# Patient Record
Sex: Female | Born: 1945 | Race: White | Hispanic: No | Marital: Single | Smoking: Former smoker
Health system: Southern US, Community
[De-identification: ages and names within clinical notes are randomized; demographics above are authoritative.]

## PROBLEM LIST (undated history)

## (undated) DIAGNOSIS — F410 Panic disorder [episodic paroxysmal anxiety] without agoraphobia: Secondary | ICD-10-CM

## (undated) DIAGNOSIS — J45909 Unspecified asthma, uncomplicated: Secondary | ICD-10-CM

## (undated) DIAGNOSIS — C189 Malignant neoplasm of colon, unspecified: Secondary | ICD-10-CM

## (undated) DIAGNOSIS — F419 Anxiety disorder, unspecified: Secondary | ICD-10-CM

## (undated) HISTORY — DX: Unspecified asthma, uncomplicated: J45.909

## (undated) HISTORY — DX: Anxiety disorder, unspecified: F41.9

## (undated) HISTORY — DX: Panic disorder (episodic paroxysmal anxiety): F41.0

## (undated) HISTORY — PX: ABDOMINAL HYSTERECTOMY: SHX81

## (undated) HISTORY — PX: COLON SURGERY: SHX602

## (undated) HISTORY — DX: Malignant neoplasm of colon, unspecified: C18.9

---

## 2010-09-01 ENCOUNTER — Emergency Department (HOSPITAL_COMMUNITY): Admission: EM | Admit: 2010-09-01 | Discharge: 2010-09-01 | Payer: Self-pay | Admitting: Emergency Medicine

## 2013-03-22 ENCOUNTER — Encounter: Payer: Self-pay | Admitting: Obstetrics & Gynecology

## 2015-05-02 ENCOUNTER — Encounter: Payer: Self-pay | Admitting: Pulmonary Disease

## 2015-05-02 ENCOUNTER — Ambulatory Visit (INDEPENDENT_AMBULATORY_CARE_PROVIDER_SITE_OTHER)
Admission: RE | Admit: 2015-05-02 | Discharge: 2015-05-02 | Disposition: A | Discharge status: Home / Self Care | Payer: Medicare Other | Source: Ambulatory Visit | Attending: Pulmonary Disease | Admitting: Pulmonary Disease

## 2015-05-02 ENCOUNTER — Ambulatory Visit (INDEPENDENT_AMBULATORY_CARE_PROVIDER_SITE_OTHER): Payer: Medicare Other | Admitting: Pulmonary Disease

## 2015-05-02 ENCOUNTER — Encounter (INDEPENDENT_AMBULATORY_CARE_PROVIDER_SITE_OTHER): Payer: Self-pay

## 2015-05-02 VITALS — BP 122/76 | HR 80 | Temp 98.1°F | Ht 60.0 in | Wt 162.4 lb

## 2015-05-02 DIAGNOSIS — J449 Chronic obstructive pulmonary disease, unspecified: Secondary | ICD-10-CM | POA: Insufficient documentation

## 2015-05-02 DIAGNOSIS — J441 Chronic obstructive pulmonary disease with (acute) exacerbation: Secondary | ICD-10-CM

## 2015-05-02 MED ORDER — UMECLIDINIUM BROMIDE 62.5 MCG/INH IN AEPB: 1.0000 | INHALATION_SPRAY | Freq: Every day | RESPIRATORY_TRACT | Status: AC

## 2015-05-02 MED ORDER — BUDESONIDE-FORMOTEROL FUMARATE 160-4.5 MCG/ACT IN AERO: 2.0000 | INHALATION_SPRAY | Freq: Two times a day (BID) | RESPIRATORY_TRACT | Status: AC

## 2015-05-02 NOTE — Patient Instructions (Addendum)
Zarina- it was nice meeting you today...  Let's work together to find the best medications to help your breathing!!!  For your part> I need to be sure you are not smoking!!!    And we need for you to start a gradual exercise program (consider joining the Y, silver sneakers, or do your own exercises at home)...    And we need for you to get on a good diet (low carb, low fat) and get the weight down!!!  I would like you to take 2 inhalers regularly> these will help open up your airways & diminish the wheezing...    SYMBICORT160- 2 sprays twice daily...    INCRUSE - one inhalation daily...    We will check to see if we can find out which meds you can get for the cheapest price...  I would also ask you to adjust your Valium10 to 1/2 tablet 4 times daily at breakfast, lunch, dinner, & bedtime...    You should carry the ALBUTEROL inhaler with you & use it as needed...  Today we did a breathing test, ambulatory oxygen test & a CXR...    We will contact you w/ the results when available...   Call for any questions...  Let's plan a follow up visit in 69mo, sooner if needed for problems.Marland KitchenMarland Kitchen
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
## 2015-05-07 ENCOUNTER — Telehealth: Payer: Self-pay | Admitting: *Deleted

## 2015-05-07 NOTE — Telephone Encounter (Signed)
PA submitted over the phone for Incruse. [employer] will report decision in 24 to 72 hrs. Pt was called and message left in regards to ins. Information.

## 2015-05-07 NOTE — Telephone Encounter (Signed)
PA has been denied. Pt must first fail Stiolto Respimat and Arcapta Neohaler. Please advise

## 2015-05-08 MED ORDER — TIOTROPIUM BROMIDE-OLODATEROL 2.5-2.5 MCG/ACT IN AERS: 2.0000 | INHALATION_SPRAY | Freq: Every day | RESPIRATORY_TRACT | Status: AC

## 2015-05-08 NOTE — Telephone Encounter (Signed)
Message printed and placed on  SN cart for review  

## 2015-05-08 NOTE — Telephone Encounter (Signed)
Called spoke with pt. Aware of change. RX sent into pharm for stiolto. Nothing further needed

## 2015-05-08 NOTE — Telephone Encounter (Signed)
Per SN, -Since insurance denied Incruse, please call in [employer] with instruction for 2 puffs once daily -Please notify pt of medication change

## 2015-05-09 ENCOUNTER — Other Ambulatory Visit: Payer: Self-pay | Admitting: Pulmonary Disease

## 2015-05-09 ENCOUNTER — Telehealth: Payer: Self-pay

## 2015-05-09 DIAGNOSIS — R918 Other nonspecific abnormal finding of lung field: Secondary | ICD-10-CM

## 2015-05-09 DIAGNOSIS — J449 Chronic obstructive pulmonary disease, unspecified: Secondary | ICD-10-CM

## 2015-05-09 DIAGNOSIS — R9389 Abnormal findings on diagnostic imaging of other specified body structures: Secondary | ICD-10-CM

## 2015-05-09 NOTE — Progress Notes (Signed)
Quick Note:  Called and spoke with pt. Reviewed results and recs. Pt voiced understanding and had no further questions. Order for CT was placed. See telephone note. ______

## 2015-05-09 NOTE — Telephone Encounter (Signed)
-----   Message from Noralee Space, MD sent at 05/05/2015 10:29 AM EDT ----- Please notify patient>   CXR shows underlying COPD & a faint LUL opacity that needs further eval- REC- sched CT Chest w/ contrast "LUL opacity on CXR"

## 2015-05-09 NOTE — Telephone Encounter (Signed)
Called and spoke with pt. Reviewed results and recs. Pt voiced understanding and had no further questions.  Order was placed for CT on 7/21 per SN recs.   REC- sched CT Chest w/ contrast "LUL opacity on CXR"   Pt is aware and awaiting phone call to schedule CT.  Nothing further needed.

## 2015-05-13 ENCOUNTER — Ambulatory Visit (INDEPENDENT_AMBULATORY_CARE_PROVIDER_SITE_OTHER)
Admission: RE | Admit: 2015-05-13 | Discharge: 2015-05-13 | Disposition: A | Discharge status: Home / Self Care | Payer: Medicare Other | Source: Ambulatory Visit | Attending: Pulmonary Disease | Admitting: Pulmonary Disease

## 2015-05-13 DIAGNOSIS — R918 Other nonspecific abnormal finding of lung field: Secondary | ICD-10-CM

## 2015-05-14 ENCOUNTER — Other Ambulatory Visit: Payer: Self-pay | Admitting: Pulmonary Disease

## 2015-05-14 DIAGNOSIS — R918 Other nonspecific abnormal finding of lung field: Secondary | ICD-10-CM

## 2015-05-24 ENCOUNTER — Other Ambulatory Visit: Payer: Self-pay | Admitting: Pulmonary Disease

## 2015-05-24 ENCOUNTER — Encounter (HOSPITAL_COMMUNITY)
Admission: RE | Admit: 2015-05-24 | Discharge: 2015-05-24 | Disposition: A | Discharge status: Home / Self Care | Payer: Medicare Other | Source: Ambulatory Visit | Attending: Pulmonary Disease | Admitting: Pulmonary Disease

## 2015-05-24 DIAGNOSIS — R918 Other nonspecific abnormal finding of lung field: Secondary | ICD-10-CM | POA: Insufficient documentation

## 2015-05-24 LAB — GLUCOSE, CAPILLARY: Glucose-Capillary: 81 mg/dL (ref 65–99)

## 2015-05-24 MED ORDER — FLUDEOXYGLUCOSE F - 18 (FDG) INJECTION
7.7300 | Freq: Once | INTRAVENOUS | Status: AC | PRN
  Administered 2015-05-24: 7.73 via INTRAVENOUS

## 2015-05-30 ENCOUNTER — Other Ambulatory Visit: Payer: Self-pay | Admitting: Pulmonary Disease

## 2015-05-30 DIAGNOSIS — R948 Abnormal results of function studies of other organs and systems: Secondary | ICD-10-CM

## 2015-06-03 ENCOUNTER — Ambulatory Visit: Payer: Medicare Other | Admitting: Pulmonary Disease

## 2015-06-11 ENCOUNTER — Ambulatory Visit: Payer: Medicare Other | Admitting: Pulmonary Disease

## 2016-03-19 DIAGNOSIS — J9621 Acute and chronic respiratory failure with hypoxia: Secondary | ICD-10-CM

## 2016-03-19 DIAGNOSIS — M1990 Unspecified osteoarthritis, unspecified site: Secondary | ICD-10-CM

## 2016-03-19 DIAGNOSIS — F419 Anxiety disorder, unspecified: Secondary | ICD-10-CM

## 2016-03-19 DIAGNOSIS — J441 Chronic obstructive pulmonary disease with (acute) exacerbation: Secondary | ICD-10-CM

## 2016-03-20 DIAGNOSIS — J441 Chronic obstructive pulmonary disease with (acute) exacerbation: Secondary | ICD-10-CM | POA: Diagnosis not present

## 2016-03-20 DIAGNOSIS — M1990 Unspecified osteoarthritis, unspecified site: Secondary | ICD-10-CM | POA: Diagnosis not present

## 2016-03-20 DIAGNOSIS — J9621 Acute and chronic respiratory failure with hypoxia: Secondary | ICD-10-CM | POA: Diagnosis not present

## 2016-03-20 DIAGNOSIS — F419 Anxiety disorder, unspecified: Secondary | ICD-10-CM | POA: Diagnosis not present

## 2016-03-21 DIAGNOSIS — J9621 Acute and chronic respiratory failure with hypoxia: Secondary | ICD-10-CM | POA: Diagnosis not present

## 2016-03-21 DIAGNOSIS — J441 Chronic obstructive pulmonary disease with (acute) exacerbation: Secondary | ICD-10-CM | POA: Diagnosis not present

## 2016-03-21 DIAGNOSIS — M1990 Unspecified osteoarthritis, unspecified site: Secondary | ICD-10-CM | POA: Diagnosis not present

## 2016-03-21 DIAGNOSIS — F419 Anxiety disorder, unspecified: Secondary | ICD-10-CM | POA: Diagnosis not present

## 2016-03-22 DIAGNOSIS — F419 Anxiety disorder, unspecified: Secondary | ICD-10-CM | POA: Diagnosis not present

## 2016-03-22 DIAGNOSIS — J9621 Acute and chronic respiratory failure with hypoxia: Secondary | ICD-10-CM | POA: Diagnosis not present

## 2016-03-22 DIAGNOSIS — J441 Chronic obstructive pulmonary disease with (acute) exacerbation: Secondary | ICD-10-CM | POA: Diagnosis not present

## 2016-03-22 DIAGNOSIS — M1990 Unspecified osteoarthritis, unspecified site: Secondary | ICD-10-CM | POA: Diagnosis not present

## 2017-04-25 DIAGNOSIS — F418 Other specified anxiety disorders: Secondary | ICD-10-CM

## 2017-04-25 DIAGNOSIS — J219 Acute bronchiolitis, unspecified: Secondary | ICD-10-CM | POA: Diagnosis not present

## 2017-04-25 DIAGNOSIS — E7800 Pure hypercholesterolemia, unspecified: Secondary | ICD-10-CM

## 2017-04-25 DIAGNOSIS — R918 Other nonspecific abnormal finding of lung field: Secondary | ICD-10-CM | POA: Diagnosis not present

## 2017-04-25 DIAGNOSIS — J9621 Acute and chronic respiratory failure with hypoxia: Secondary | ICD-10-CM

## 2017-04-25 DIAGNOSIS — J441 Chronic obstructive pulmonary disease with (acute) exacerbation: Secondary | ICD-10-CM

## 2017-04-25 DIAGNOSIS — I10 Essential (primary) hypertension: Secondary | ICD-10-CM | POA: Diagnosis not present

## 2017-04-25 DIAGNOSIS — I248 Other forms of acute ischemic heart disease: Secondary | ICD-10-CM | POA: Diagnosis not present

## 2017-04-26 DIAGNOSIS — R0602 Shortness of breath: Secondary | ICD-10-CM | POA: Diagnosis not present

## 2017-04-26 DIAGNOSIS — J219 Acute bronchiolitis, unspecified: Secondary | ICD-10-CM | POA: Diagnosis not present

## 2017-04-26 DIAGNOSIS — J9621 Acute and chronic respiratory failure with hypoxia: Secondary | ICD-10-CM | POA: Diagnosis not present

## 2017-04-26 DIAGNOSIS — F418 Other specified anxiety disorders: Secondary | ICD-10-CM | POA: Diagnosis not present

## 2017-04-26 DIAGNOSIS — J441 Chronic obstructive pulmonary disease with (acute) exacerbation: Secondary | ICD-10-CM | POA: Diagnosis not present

## 2017-04-26 DIAGNOSIS — I10 Essential (primary) hypertension: Secondary | ICD-10-CM | POA: Diagnosis not present

## 2017-04-26 DIAGNOSIS — I248 Other forms of acute ischemic heart disease: Secondary | ICD-10-CM | POA: Diagnosis not present

## 2017-04-26 DIAGNOSIS — R918 Other nonspecific abnormal finding of lung field: Secondary | ICD-10-CM | POA: Diagnosis not present

## 2017-04-27 DIAGNOSIS — R918 Other nonspecific abnormal finding of lung field: Secondary | ICD-10-CM | POA: Diagnosis not present

## 2017-04-27 DIAGNOSIS — I248 Other forms of acute ischemic heart disease: Secondary | ICD-10-CM | POA: Diagnosis not present

## 2017-04-27 DIAGNOSIS — J441 Chronic obstructive pulmonary disease with (acute) exacerbation: Secondary | ICD-10-CM | POA: Diagnosis not present

## 2017-04-27 DIAGNOSIS — I10 Essential (primary) hypertension: Secondary | ICD-10-CM | POA: Diagnosis not present

## 2017-04-27 DIAGNOSIS — J9621 Acute and chronic respiratory failure with hypoxia: Secondary | ICD-10-CM | POA: Diagnosis not present

## 2017-04-27 DIAGNOSIS — J219 Acute bronchiolitis, unspecified: Secondary | ICD-10-CM | POA: Diagnosis not present

## 2017-04-27 DIAGNOSIS — F418 Other specified anxiety disorders: Secondary | ICD-10-CM | POA: Diagnosis not present

## 2017-04-28 DIAGNOSIS — J441 Chronic obstructive pulmonary disease with (acute) exacerbation: Secondary | ICD-10-CM | POA: Diagnosis not present

## 2017-04-28 DIAGNOSIS — J219 Acute bronchiolitis, unspecified: Secondary | ICD-10-CM | POA: Diagnosis not present

## 2017-04-28 DIAGNOSIS — I10 Essential (primary) hypertension: Secondary | ICD-10-CM | POA: Diagnosis not present

## 2017-04-28 DIAGNOSIS — J9621 Acute and chronic respiratory failure with hypoxia: Secondary | ICD-10-CM | POA: Diagnosis not present

## 2017-04-28 DIAGNOSIS — F418 Other specified anxiety disorders: Secondary | ICD-10-CM | POA: Diagnosis not present

## 2017-04-28 DIAGNOSIS — I248 Other forms of acute ischemic heart disease: Secondary | ICD-10-CM | POA: Diagnosis not present

## 2017-04-28 DIAGNOSIS — R918 Other nonspecific abnormal finding of lung field: Secondary | ICD-10-CM | POA: Diagnosis not present

## 2017-04-29 DIAGNOSIS — J9621 Acute and chronic respiratory failure with hypoxia: Secondary | ICD-10-CM | POA: Diagnosis not present

## 2017-04-29 DIAGNOSIS — J219 Acute bronchiolitis, unspecified: Secondary | ICD-10-CM | POA: Diagnosis not present

## 2017-04-29 DIAGNOSIS — I248 Other forms of acute ischemic heart disease: Secondary | ICD-10-CM | POA: Diagnosis not present

## 2017-04-29 DIAGNOSIS — F418 Other specified anxiety disorders: Secondary | ICD-10-CM | POA: Diagnosis not present

## 2017-04-29 DIAGNOSIS — J441 Chronic obstructive pulmonary disease with (acute) exacerbation: Secondary | ICD-10-CM | POA: Diagnosis not present

## 2017-04-29 DIAGNOSIS — R918 Other nonspecific abnormal finding of lung field: Secondary | ICD-10-CM | POA: Diagnosis not present

## 2017-04-29 DIAGNOSIS — I10 Essential (primary) hypertension: Secondary | ICD-10-CM | POA: Diagnosis not present

## 2017-05-13 ENCOUNTER — Ambulatory Visit: Payer: Medicare Other | Admitting: Pulmonary Disease

## 2017-05-17 IMAGING — CT CT CHEST W/O CM
2 of 4 series · 15 of 36 positions shown, 18 images · IV contrast (Omnipaque 300)
Comparison: Chest x-ray from 05/02/2015.

CLINICAL DATA: Left upper lobe abnormality seen on recent chest
x-ray.

EXAM:
CT CHEST WITHOUT CONTRAST
TECHNIQUE: Multidetector CT imaging of the chest was performed following the
standard protocol without IV contrast.

[Series 2: chest routine with · axial · 0.67mm/px · z∈[-293,-38]mm · 12 of 61 slices shown, 15 images]
[im 5/61  mediastinal]
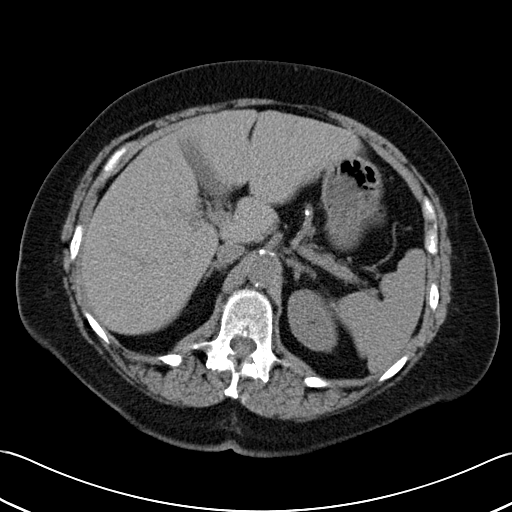
[im 5/61  lung]
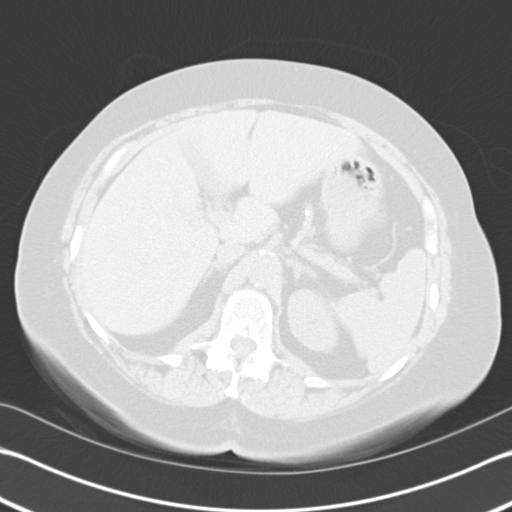
[im 10/61  lung]
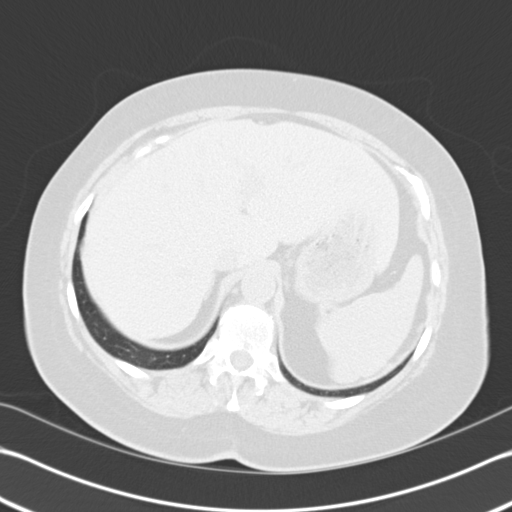
[im 14/61  lung]
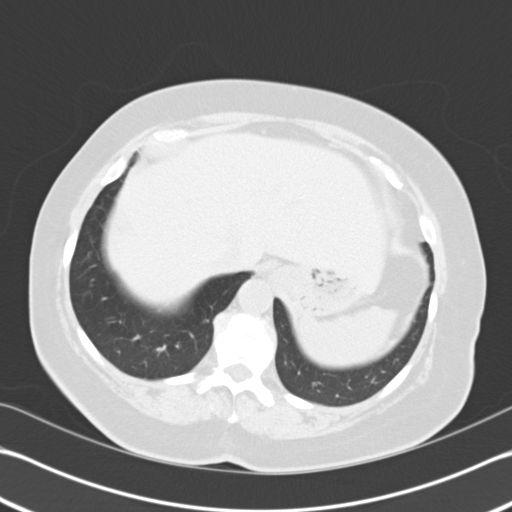
[im 19/61  lung]
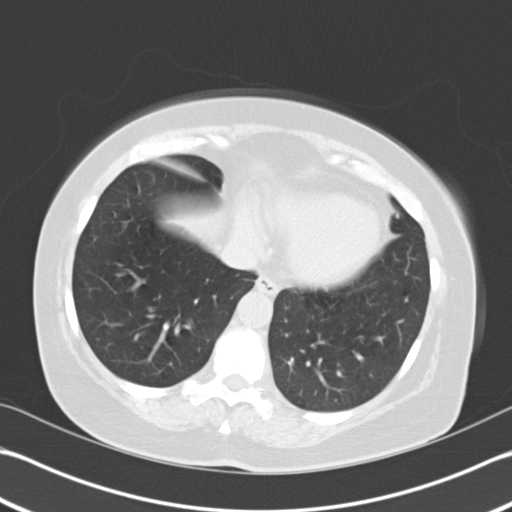
[im 24/61  mediastinal]
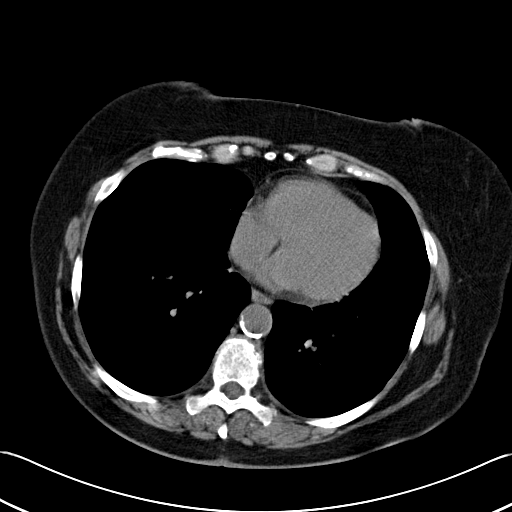
[im 24/61  lung]
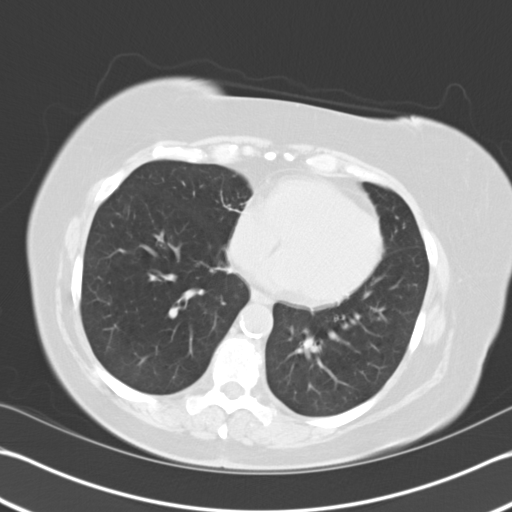
[im 28/61  lung]
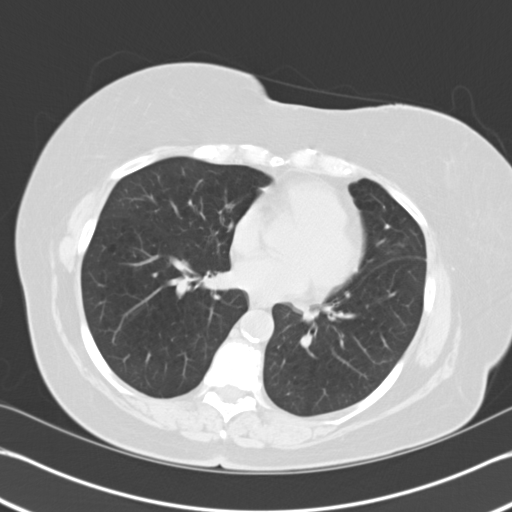
[im 33/61  lung]
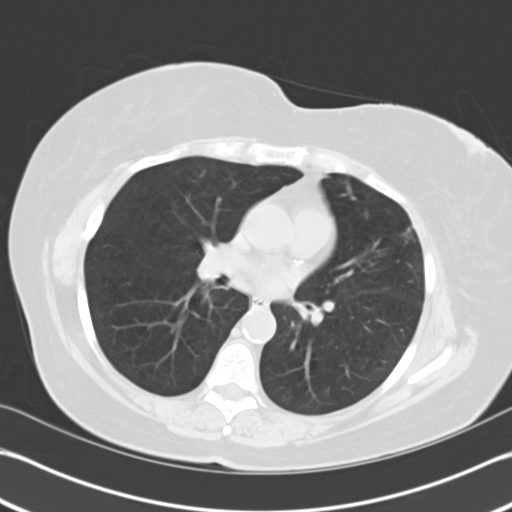
[im 37/61  lung]
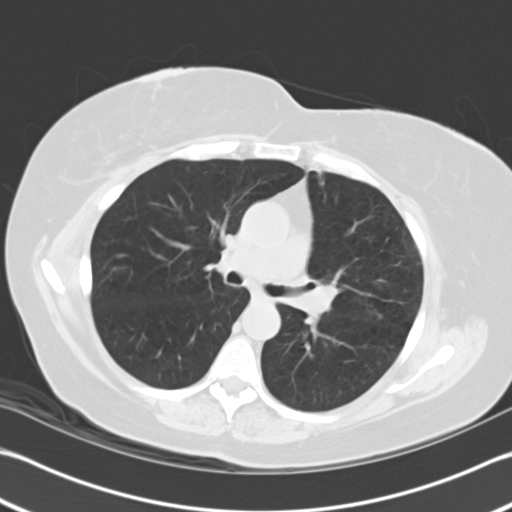
[im 42/61  mediastinal]
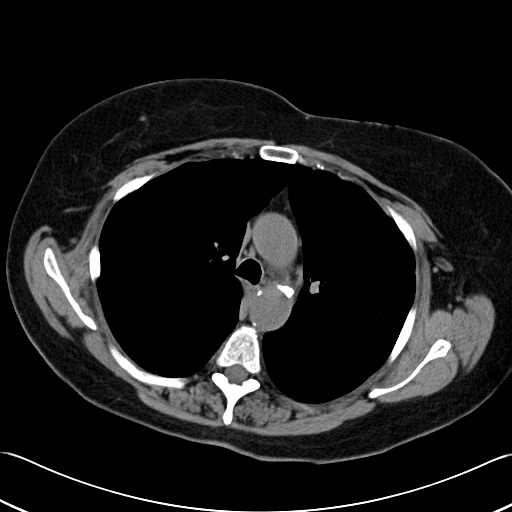
[im 42/61  lung]
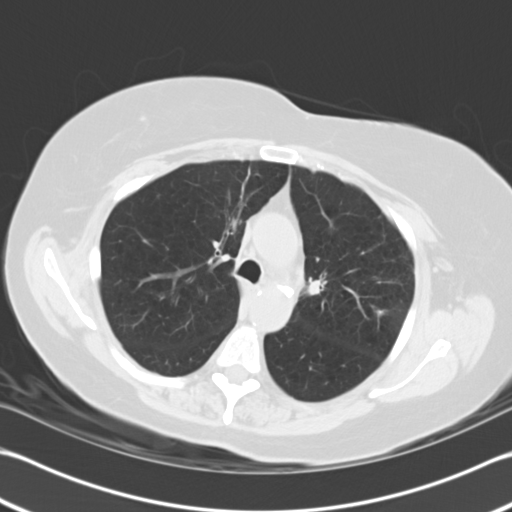
[im 47/61  lung]
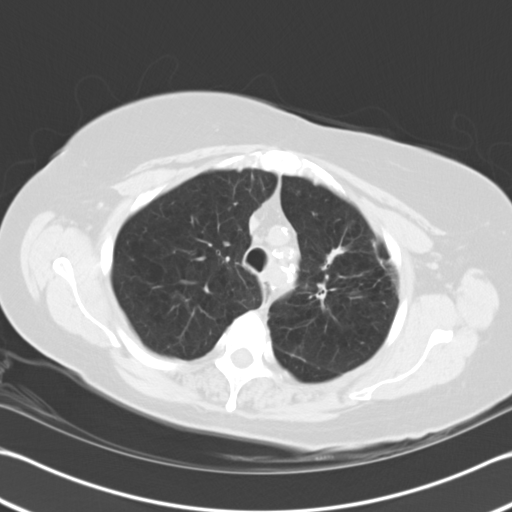
[im 51/61  lung]
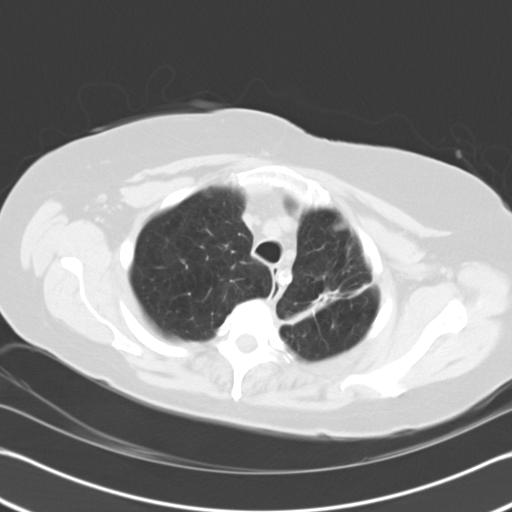
[im 56/61  lung]
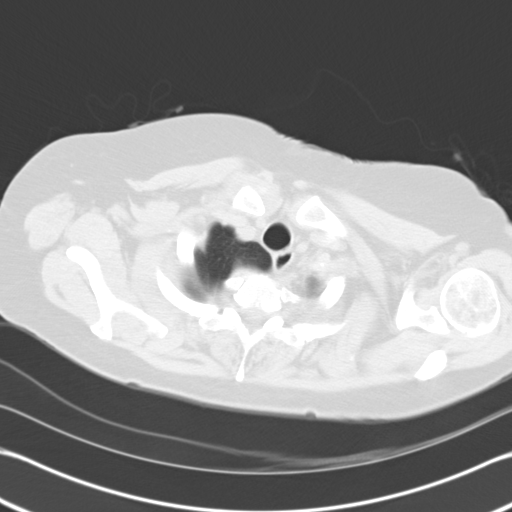

[Series 602: con · coronal · 0.67mm/px · 3 of 105 slices shown]
[im 21/105  lung]
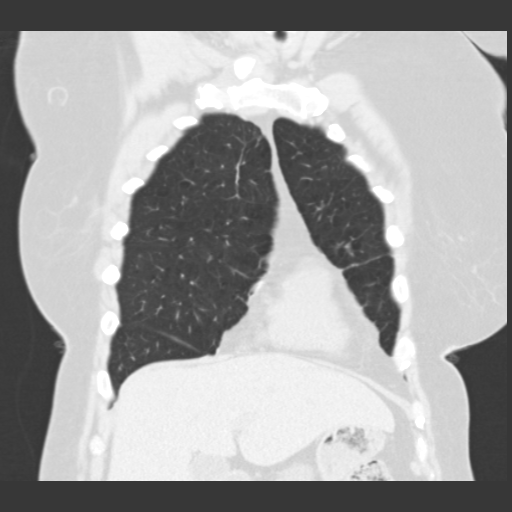
[im 42/105  lung]
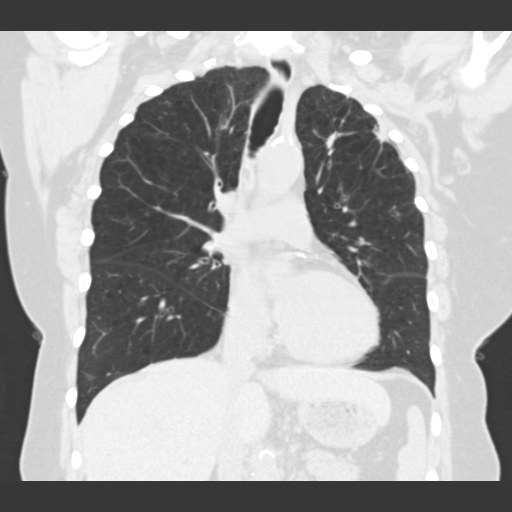
[im 63/105  lung]
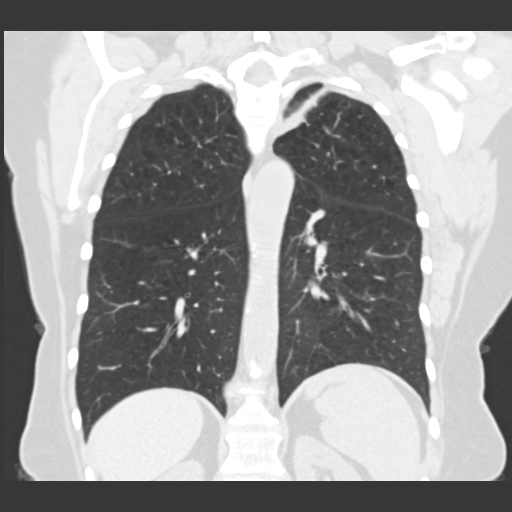

[15 of 36 positions shown; findings below may reference images not displayed]

FINDINGS: Mediastinum / Lymph Nodes: There is no axillary lymphadenopathy. No
mediastinal or hilar lymphadenopathy. Heart size is normal. Coronary
artery calcification is noted. No pericardial effusion.

Lungs / Pleura: Lung windows show advanced centrilobular emphysema.
There is a bandlike area of pleural-parenchymal opacity in the left
upper lobe extending down from the apex. This corresponds to the
abnormality seen on the recent chest x-ray and was not visible on a
chest x-ray 01/14/2013. This bandlike opacity has a nodular
component cranially measuring 2.1 x 2.0 cm and a smaller 5 mm
nodular component inferiorly. Posteriorly in the left upper lobe is
a 10 x 6 mm nodule (image 21 series 3). 5 mm nodule in the anterior
right lower lobe is stable since a CT urogram of 07/21/2011

No pulmonary edema. No focal airspace consolidation. No evidence for
pleural effusion.

[HOSPITAL] / Soft Tissues: Bone windows reveal no worrisome lytic or
sclerotic osseous lesions.

Upper Abdomen: 14 mm calcified stone identified in the gallbladder
fundus.
IMPRESSION: Advanced emphysema within new relatively large platelike area of
pleural-parenchymal opacity in the left upper lobe since the chest
x-ray from 01/14/2013. This platelike opacity has at least 2 nodular
components and an adjacent satellite nodule measuring up to 10 mm.
This could potentially be related to scar, but given the interval
appearance, PET-CT is recommended to exclude hypermetabolism within
the lesion as neoplasm is also a possibility.

## 2017-06-12 DIAGNOSIS — J449 Chronic obstructive pulmonary disease, unspecified: Secondary | ICD-10-CM

## 2017-06-12 DIAGNOSIS — R0902 Hypoxemia: Secondary | ICD-10-CM | POA: Diagnosis not present

## 2017-06-12 DIAGNOSIS — F13239 Sedative, hypnotic or anxiolytic dependence with withdrawal, unspecified: Secondary | ICD-10-CM | POA: Diagnosis not present

## 2017-06-12 DIAGNOSIS — J9600 Acute respiratory failure, unspecified whether with hypoxia or hypercapnia: Secondary | ICD-10-CM

## 2017-06-13 DIAGNOSIS — R0902 Hypoxemia: Secondary | ICD-10-CM | POA: Diagnosis not present

## 2017-06-13 DIAGNOSIS — F13239 Sedative, hypnotic or anxiolytic dependence with withdrawal, unspecified: Secondary | ICD-10-CM | POA: Diagnosis not present

## 2017-06-13 DIAGNOSIS — J9600 Acute respiratory failure, unspecified whether with hypoxia or hypercapnia: Secondary | ICD-10-CM | POA: Diagnosis not present

## 2017-06-13 DIAGNOSIS — J449 Chronic obstructive pulmonary disease, unspecified: Secondary | ICD-10-CM | POA: Diagnosis not present

## 2017-06-14 DIAGNOSIS — J449 Chronic obstructive pulmonary disease, unspecified: Secondary | ICD-10-CM | POA: Diagnosis not present

## 2017-06-14 DIAGNOSIS — J9600 Acute respiratory failure, unspecified whether with hypoxia or hypercapnia: Secondary | ICD-10-CM | POA: Diagnosis not present

## 2017-06-14 DIAGNOSIS — R0902 Hypoxemia: Secondary | ICD-10-CM | POA: Diagnosis not present

## 2017-06-14 DIAGNOSIS — F13239 Sedative, hypnotic or anxiolytic dependence with withdrawal, unspecified: Secondary | ICD-10-CM | POA: Diagnosis not present

## 2017-11-21 DIAGNOSIS — R0600 Dyspnea, unspecified: Secondary | ICD-10-CM | POA: Diagnosis not present

## 2017-11-21 DIAGNOSIS — J9621 Acute and chronic respiratory failure with hypoxia: Secondary | ICD-10-CM | POA: Diagnosis not present

## 2017-11-21 DIAGNOSIS — J441 Chronic obstructive pulmonary disease with (acute) exacerbation: Secondary | ICD-10-CM

## 2017-11-21 DIAGNOSIS — J440 Chronic obstructive pulmonary disease with acute lower respiratory infection: Secondary | ICD-10-CM | POA: Diagnosis not present

## 2017-11-21 DIAGNOSIS — J15212 Pneumonia due to Methicillin resistant Staphylococcus aureus: Secondary | ICD-10-CM | POA: Diagnosis not present

## 2017-11-22 DIAGNOSIS — R0600 Dyspnea, unspecified: Secondary | ICD-10-CM | POA: Diagnosis not present

## 2017-11-22 DIAGNOSIS — J9621 Acute and chronic respiratory failure with hypoxia: Secondary | ICD-10-CM | POA: Diagnosis not present

## 2017-11-22 DIAGNOSIS — J15212 Pneumonia due to Methicillin resistant Staphylococcus aureus: Secondary | ICD-10-CM | POA: Diagnosis not present

## 2017-11-22 DIAGNOSIS — J440 Chronic obstructive pulmonary disease with acute lower respiratory infection: Secondary | ICD-10-CM | POA: Diagnosis not present

## 2017-11-22 DIAGNOSIS — J441 Chronic obstructive pulmonary disease with (acute) exacerbation: Secondary | ICD-10-CM | POA: Diagnosis not present

## 2017-11-23 DIAGNOSIS — J440 Chronic obstructive pulmonary disease with acute lower respiratory infection: Secondary | ICD-10-CM | POA: Diagnosis not present

## 2017-11-23 DIAGNOSIS — J9621 Acute and chronic respiratory failure with hypoxia: Secondary | ICD-10-CM | POA: Diagnosis not present

## 2017-11-23 DIAGNOSIS — J15212 Pneumonia due to Methicillin resistant Staphylococcus aureus: Secondary | ICD-10-CM | POA: Diagnosis not present

## 2017-11-23 DIAGNOSIS — J441 Chronic obstructive pulmonary disease with (acute) exacerbation: Secondary | ICD-10-CM | POA: Diagnosis not present

## 2017-11-23 DIAGNOSIS — R0600 Dyspnea, unspecified: Secondary | ICD-10-CM | POA: Diagnosis not present

## 2017-11-24 DIAGNOSIS — J441 Chronic obstructive pulmonary disease with (acute) exacerbation: Secondary | ICD-10-CM | POA: Diagnosis not present

## 2017-11-24 DIAGNOSIS — R0600 Dyspnea, unspecified: Secondary | ICD-10-CM | POA: Diagnosis not present

## 2017-11-24 DIAGNOSIS — J440 Chronic obstructive pulmonary disease with acute lower respiratory infection: Secondary | ICD-10-CM | POA: Diagnosis not present

## 2017-11-24 DIAGNOSIS — J9621 Acute and chronic respiratory failure with hypoxia: Secondary | ICD-10-CM | POA: Diagnosis not present

## 2017-11-24 DIAGNOSIS — J15212 Pneumonia due to Methicillin resistant Staphylococcus aureus: Secondary | ICD-10-CM | POA: Diagnosis not present

## 2017-11-25 DIAGNOSIS — R0602 Shortness of breath: Secondary | ICD-10-CM

## 2017-11-25 DIAGNOSIS — J441 Chronic obstructive pulmonary disease with (acute) exacerbation: Secondary | ICD-10-CM | POA: Diagnosis not present

## 2017-11-25 DIAGNOSIS — R0600 Dyspnea, unspecified: Secondary | ICD-10-CM | POA: Diagnosis not present

## 2017-11-25 DIAGNOSIS — J440 Chronic obstructive pulmonary disease with acute lower respiratory infection: Secondary | ICD-10-CM | POA: Diagnosis not present

## 2017-11-25 DIAGNOSIS — J9621 Acute and chronic respiratory failure with hypoxia: Secondary | ICD-10-CM | POA: Diagnosis not present

## 2017-11-25 DIAGNOSIS — J15212 Pneumonia due to Methicillin resistant Staphylococcus aureus: Secondary | ICD-10-CM | POA: Diagnosis not present

## 2017-11-26 DIAGNOSIS — J9621 Acute and chronic respiratory failure with hypoxia: Secondary | ICD-10-CM | POA: Diagnosis not present

## 2017-11-26 DIAGNOSIS — J440 Chronic obstructive pulmonary disease with acute lower respiratory infection: Secondary | ICD-10-CM | POA: Diagnosis not present

## 2017-11-26 DIAGNOSIS — J15212 Pneumonia due to Methicillin resistant Staphylococcus aureus: Secondary | ICD-10-CM | POA: Diagnosis not present

## 2017-11-26 DIAGNOSIS — J441 Chronic obstructive pulmonary disease with (acute) exacerbation: Secondary | ICD-10-CM | POA: Diagnosis not present

## 2017-11-26 DIAGNOSIS — R0600 Dyspnea, unspecified: Secondary | ICD-10-CM | POA: Diagnosis not present

## 2017-11-27 DIAGNOSIS — J441 Chronic obstructive pulmonary disease with (acute) exacerbation: Secondary | ICD-10-CM | POA: Diagnosis not present

## 2017-11-27 DIAGNOSIS — J15212 Pneumonia due to Methicillin resistant Staphylococcus aureus: Secondary | ICD-10-CM | POA: Diagnosis not present

## 2017-11-27 DIAGNOSIS — J9621 Acute and chronic respiratory failure with hypoxia: Secondary | ICD-10-CM | POA: Diagnosis not present

## 2017-11-27 DIAGNOSIS — J440 Chronic obstructive pulmonary disease with acute lower respiratory infection: Secondary | ICD-10-CM | POA: Diagnosis not present

## 2017-11-27 DIAGNOSIS — R0600 Dyspnea, unspecified: Secondary | ICD-10-CM | POA: Diagnosis not present

## 2017-11-28 DIAGNOSIS — J441 Chronic obstructive pulmonary disease with (acute) exacerbation: Secondary | ICD-10-CM | POA: Diagnosis not present

## 2017-11-28 DIAGNOSIS — J440 Chronic obstructive pulmonary disease with acute lower respiratory infection: Secondary | ICD-10-CM | POA: Diagnosis not present

## 2017-11-28 DIAGNOSIS — J15212 Pneumonia due to Methicillin resistant Staphylococcus aureus: Secondary | ICD-10-CM | POA: Diagnosis not present

## 2017-11-28 DIAGNOSIS — J9621 Acute and chronic respiratory failure with hypoxia: Secondary | ICD-10-CM | POA: Diagnosis not present

## 2017-11-28 DIAGNOSIS — R0600 Dyspnea, unspecified: Secondary | ICD-10-CM | POA: Diagnosis not present

## 2017-11-29 DIAGNOSIS — J9621 Acute and chronic respiratory failure with hypoxia: Secondary | ICD-10-CM | POA: Diagnosis not present

## 2017-11-29 DIAGNOSIS — J440 Chronic obstructive pulmonary disease with acute lower respiratory infection: Secondary | ICD-10-CM | POA: Diagnosis not present

## 2017-11-29 DIAGNOSIS — R0600 Dyspnea, unspecified: Secondary | ICD-10-CM | POA: Diagnosis not present

## 2017-11-29 DIAGNOSIS — J441 Chronic obstructive pulmonary disease with (acute) exacerbation: Secondary | ICD-10-CM | POA: Diagnosis not present

## 2017-11-29 DIAGNOSIS — J15212 Pneumonia due to Methicillin resistant Staphylococcus aureus: Secondary | ICD-10-CM | POA: Diagnosis not present

## 2017-11-30 DIAGNOSIS — J9621 Acute and chronic respiratory failure with hypoxia: Secondary | ICD-10-CM | POA: Diagnosis not present

## 2017-11-30 DIAGNOSIS — J440 Chronic obstructive pulmonary disease with acute lower respiratory infection: Secondary | ICD-10-CM | POA: Diagnosis not present

## 2017-11-30 DIAGNOSIS — J441 Chronic obstructive pulmonary disease with (acute) exacerbation: Secondary | ICD-10-CM | POA: Diagnosis not present

## 2017-11-30 DIAGNOSIS — R0600 Dyspnea, unspecified: Secondary | ICD-10-CM | POA: Diagnosis not present

## 2017-11-30 DIAGNOSIS — J15212 Pneumonia due to Methicillin resistant Staphylococcus aureus: Secondary | ICD-10-CM | POA: Diagnosis not present

## 2018-02-16 DEATH — deceased
